# Patient Record
Sex: Male | Born: 1960 | Race: White | Hispanic: No | Smoking: Never smoker
Health system: Southern US, Community
[De-identification: ages and names within clinical notes are randomized; demographics above are authoritative.]

## PROBLEM LIST (undated history)

## (undated) DIAGNOSIS — J320 Chronic maxillary sinusitis: Secondary | ICD-10-CM

## (undated) DIAGNOSIS — M109 Gout, unspecified: Secondary | ICD-10-CM

## (undated) DIAGNOSIS — I10 Essential (primary) hypertension: Secondary | ICD-10-CM

## (undated) DIAGNOSIS — E7800 Pure hypercholesterolemia, unspecified: Secondary | ICD-10-CM

## (undated) DIAGNOSIS — G5622 Lesion of ulnar nerve, left upper limb: Secondary | ICD-10-CM

## (undated) DIAGNOSIS — M791 Myalgia, unspecified site: Secondary | ICD-10-CM

## (undated) DIAGNOSIS — R011 Cardiac murmur, unspecified: Secondary | ICD-10-CM

## (undated) DIAGNOSIS — S92109A Unspecified fracture of unspecified talus, initial encounter for closed fracture: Secondary | ICD-10-CM

## (undated) DIAGNOSIS — E785 Hyperlipidemia, unspecified: Secondary | ICD-10-CM

## (undated) DIAGNOSIS — R200 Anesthesia of skin: Secondary | ICD-10-CM

## (undated) DIAGNOSIS — R748 Abnormal levels of other serum enzymes: Secondary | ICD-10-CM

## (undated) DIAGNOSIS — D1801 Hemangioma of skin and subcutaneous tissue: Secondary | ICD-10-CM

## (undated) DIAGNOSIS — R6884 Jaw pain: Secondary | ICD-10-CM

## (undated) HISTORY — DX: Essential (primary) hypertension: I10

## (undated) HISTORY — DX: Lesion of ulnar nerve, left upper limb: G56.22

## (undated) HISTORY — DX: Cardiac murmur, unspecified: R01.1

## (undated) HISTORY — DX: Chronic maxillary sinusitis: J32.0

## (undated) HISTORY — DX: Abnormal levels of other serum enzymes: R74.8

## (undated) HISTORY — DX: Hyperlipidemia, unspecified: E78.5

## (undated) HISTORY — DX: Myalgia, unspecified site: M79.10

## (undated) HISTORY — DX: Pure hypercholesterolemia, unspecified: E78.00

## (undated) HISTORY — PX: ORIF ANKLE FRACTURE: SUR919

## (undated) HISTORY — DX: Jaw pain: R68.84

## (undated) HISTORY — DX: Anesthesia of skin: R20.0

## (undated) HISTORY — PX: KNEE ARTHROSCOPY: SUR90

## (undated) HISTORY — DX: Hemangioma of skin and subcutaneous tissue: D18.01

## (undated) HISTORY — DX: Unspecified fracture of unspecified talus, initial encounter for closed fracture: S92.109A

---

## 2011-11-02 DIAGNOSIS — I10 Essential (primary) hypertension: Secondary | ICD-10-CM | POA: Insufficient documentation

## 2014-09-18 ENCOUNTER — Emergency Department (HOSPITAL_COMMUNITY): Payer: BLUE CROSS/BLUE SHIELD | Admitting: Anesthesiology

## 2014-09-18 ENCOUNTER — Emergency Department (HOSPITAL_COMMUNITY): Payer: BLUE CROSS/BLUE SHIELD

## 2014-09-18 ENCOUNTER — Encounter (HOSPITAL_COMMUNITY): Payer: Self-pay | Admitting: Emergency Medicine

## 2014-09-18 ENCOUNTER — Inpatient Hospital Stay (HOSPITAL_COMMUNITY)
Admission: EM | Admit: 2014-09-18 | Discharge: 2014-09-20 | DRG: 343 | Disposition: A | Discharge status: Home / Self Care | Payer: BLUE CROSS/BLUE SHIELD | Attending: Surgery | Admitting: Surgery

## 2014-09-18 ENCOUNTER — Encounter (HOSPITAL_COMMUNITY): Admission: EM | Disposition: A | Discharge status: Home / Self Care | Payer: Self-pay | Source: Home / Self Care

## 2014-09-18 DIAGNOSIS — R1031 Right lower quadrant pain: Secondary | ICD-10-CM

## 2014-09-18 DIAGNOSIS — I10 Essential (primary) hypertension: Secondary | ICD-10-CM | POA: Diagnosis present

## 2014-09-18 DIAGNOSIS — Z79899 Other long term (current) drug therapy: Secondary | ICD-10-CM

## 2014-09-18 DIAGNOSIS — K3580 Unspecified acute appendicitis: Secondary | ICD-10-CM | POA: Diagnosis not present

## 2014-09-18 DIAGNOSIS — K37 Unspecified appendicitis: Secondary | ICD-10-CM | POA: Diagnosis present

## 2014-09-18 DIAGNOSIS — K36 Other appendicitis: Secondary | ICD-10-CM

## 2014-09-18 DIAGNOSIS — R109 Unspecified abdominal pain: Secondary | ICD-10-CM

## 2014-09-18 DIAGNOSIS — K3532 Acute appendicitis with perforation and localized peritonitis, without abscess: Secondary | ICD-10-CM | POA: Diagnosis present

## 2014-09-18 DIAGNOSIS — E780 Pure hypercholesterolemia: Secondary | ICD-10-CM | POA: Diagnosis present

## 2014-09-18 HISTORY — PX: LAPAROSCOPIC APPENDECTOMY: SHX408

## 2014-09-18 HISTORY — DX: Essential (primary) hypertension: I10

## 2014-09-18 HISTORY — DX: Pure hypercholesterolemia, unspecified: E78.00

## 2014-09-18 HISTORY — DX: Gout, unspecified: M10.9

## 2014-09-18 LAB — CBC WITH DIFFERENTIAL/PLATELET
BASOS ABS: 0 10*3/uL (ref 0.0–0.1)
Basophils Relative: 0 % (ref 0–1)
EOS ABS: 0 10*3/uL (ref 0.0–0.7)
Eosinophils Relative: 0 % (ref 0–5)
HEMATOCRIT: 45.1 % (ref 39.0–52.0)
Hemoglobin: 15.2 g/dL (ref 13.0–17.0)
LYMPHS PCT: 5 % — AB (ref 12–46)
Lymphs Abs: 0.7 10*3/uL (ref 0.7–4.0)
MCH: 30.2 pg (ref 26.0–34.0)
MCHC: 33.7 g/dL (ref 30.0–36.0)
MCV: 89.7 fL (ref 78.0–100.0)
Monocytes Absolute: 1.8 10*3/uL — ABNORMAL HIGH (ref 0.1–1.0)
Monocytes Relative: 14 % — ABNORMAL HIGH (ref 3–12)
NEUTROS ABS: 10.8 10*3/uL — AB (ref 1.7–7.7)
Neutrophils Relative %: 81 % — ABNORMAL HIGH (ref 43–77)
Platelets: 183 10*3/uL (ref 150–400)
RBC: 5.03 MIL/uL (ref 4.22–5.81)
RDW: 13.3 % (ref 11.5–15.5)
WBC: 13.4 10*3/uL — AB (ref 4.0–10.5)

## 2014-09-18 LAB — BASIC METABOLIC PANEL
Anion gap: 10 (ref 5–15)
BUN: 12 mg/dL (ref 6–23)
CHLORIDE: 107 mmol/L (ref 96–112)
CO2: 24 mmol/L (ref 19–32)
CREATININE: 1.08 mg/dL (ref 0.50–1.35)
Calcium: 9.3 mg/dL (ref 8.4–10.5)
GFR calc Af Amer: 87 mL/min — ABNORMAL LOW (ref >90)
GFR calc non Af Amer: 75 mL/min — ABNORMAL LOW (ref >90)
Glucose, Bld: 107 mg/dL — ABNORMAL HIGH (ref 70–99)
Potassium: 3.4 mmol/L — ABNORMAL LOW (ref 3.5–5.1)
Sodium: 141 mmol/L (ref 135–145)

## 2014-09-18 SURGERY — APPENDECTOMY, LAPAROSCOPIC
Anesthesia: General

## 2014-09-18 MED ORDER — ONDANSETRON HCL 4 MG PO TABS: 4.0000 mg | ORAL_TABLET | Freq: Four times a day (QID) | ORAL | Status: DC | PRN

## 2014-09-18 MED ORDER — DEXAMETHASONE SODIUM PHOSPHATE 10 MG/ML IJ SOLN
INTRAMUSCULAR | Status: AC
  Filled 2014-09-18: qty 1

## 2014-09-18 MED ORDER — GLYCOPYRROLATE 0.2 MG/ML IJ SOLN
INTRAMUSCULAR | Status: DC | PRN
  Administered 2014-09-18: .6 mg via INTRAVENOUS

## 2014-09-18 MED ORDER — MIDAZOLAM HCL 5 MG/5ML IJ SOLN
INTRAMUSCULAR | Status: DC | PRN
  Administered 2014-09-18 (×2): 1 mg via INTRAVENOUS

## 2014-09-18 MED ORDER — HYDROMORPHONE HCL 1 MG/ML IJ SOLN
INTRAMUSCULAR | Status: AC
  Filled 2014-09-18: qty 1

## 2014-09-18 MED ORDER — IBUPROFEN 200 MG PO TABS: 600.0000 mg | ORAL_TABLET | Freq: Four times a day (QID) | ORAL | Status: DC | PRN

## 2014-09-18 MED ORDER — MIDAZOLAM HCL 2 MG/2ML IJ SOLN
INTRAMUSCULAR | Status: AC
  Filled 2014-09-18: qty 2

## 2014-09-18 MED ORDER — CIPROFLOXACIN IN D5W 400 MG/200ML IV SOLN
INTRAVENOUS | Status: AC
  Filled 2014-09-18: qty 200

## 2014-09-18 MED ORDER — ACETAMINOPHEN 325 MG PO TABS: 650.0000 mg | ORAL_TABLET | Freq: Four times a day (QID) | ORAL | Status: DC | PRN

## 2014-09-18 MED ORDER — KCL IN DEXTROSE-NACL 20-5-0.45 MEQ/L-%-% IV SOLN
INTRAVENOUS | Status: DC
  Administered 2014-09-19: via INTRAVENOUS
  Filled 2014-09-18 (×3): qty 1000

## 2014-09-18 MED ORDER — METRONIDAZOLE IN NACL 5-0.79 MG/ML-% IV SOLN
500.0000 mg | Freq: Three times a day (TID) | INTRAVENOUS | Status: DC
Start: 2014-09-19 — End: 2014-09-20
  Administered 2014-09-19 – 2014-09-20 (×4): 500 mg via INTRAVENOUS
  Filled 2014-09-18 (×6): qty 100

## 2014-09-18 MED ORDER — ONDANSETRON HCL 4 MG/2ML IJ SOLN
INTRAMUSCULAR | Status: AC
  Filled 2014-09-18: qty 2

## 2014-09-18 MED ORDER — SODIUM CHLORIDE 0.9 % IV SOLN
INTRAVENOUS | Status: DC
  Administered 2014-09-18: 16:00:00 via INTRAVENOUS

## 2014-09-18 MED ORDER — HYDROMORPHONE HCL 1 MG/ML IJ SOLN
0.2500 mg | INTRAMUSCULAR | Status: DC | PRN
  Administered 2014-09-18 (×2): 0.5 mg via INTRAVENOUS

## 2014-09-18 MED ORDER — PROPOFOL 10 MG/ML IV BOLUS
INTRAVENOUS | Status: DC | PRN
  Administered 2014-09-18: 150 mg via INTRAVENOUS

## 2014-09-18 MED ORDER — IOHEXOL 300 MG/ML  SOLN
25.0000 mL | Freq: Once | INTRAMUSCULAR | Status: AC | PRN
  Administered 2014-09-18: 25 mL via ORAL

## 2014-09-18 MED ORDER — MORPHINE SULFATE 4 MG/ML IJ SOLN
4.0000 mg | Freq: Once | INTRAMUSCULAR | Status: AC
  Administered 2014-09-18: 4 mg via INTRAVENOUS
  Filled 2014-09-18: qty 1

## 2014-09-18 MED ORDER — SUCCINYLCHOLINE CHLORIDE 20 MG/ML IJ SOLN
INTRAMUSCULAR | Status: DC | PRN
  Administered 2014-09-18: 100 mg via INTRAVENOUS

## 2014-09-18 MED ORDER — LABETALOL HCL 5 MG/ML IV SOLN
5.0000 mg | INTRAVENOUS | Status: DC | PRN
Start: 2014-09-18 — End: 2014-09-18
  Administered 2014-09-18: 5 mg via INTRAVENOUS

## 2014-09-18 MED ORDER — HYDROCODONE-ACETAMINOPHEN 5-325 MG PO TABS
1.0000 | ORAL_TABLET | ORAL | Status: DC | PRN
  Administered 2014-09-18: 2 via ORAL
  Administered 2014-09-19 – 2014-09-20 (×4): 1 via ORAL
  Filled 2014-09-18 (×4): qty 1
  Filled 2014-09-18: qty 2

## 2014-09-18 MED ORDER — ONDANSETRON HCL 4 MG/2ML IJ SOLN: 4.0000 mg | Freq: Four times a day (QID) | INTRAMUSCULAR | Status: DC | PRN

## 2014-09-18 MED ORDER — HEPARIN SODIUM (PORCINE) 5000 UNIT/ML IJ SOLN
5000.0000 [IU] | Freq: Three times a day (TID) | INTRAMUSCULAR | Status: DC
  Filled 2014-09-18 (×7): qty 1

## 2014-09-18 MED ORDER — FENTANYL CITRATE (PF) 250 MCG/5ML IJ SOLN
INTRAMUSCULAR | Status: DC | PRN
  Administered 2014-09-18: 100 ug via INTRAVENOUS
  Administered 2014-09-18 (×3): 50 ug via INTRAVENOUS

## 2014-09-18 MED ORDER — BUPIVACAINE-EPINEPHRINE (PF) 0.25% -1:200000 IJ SOLN
INTRAMUSCULAR | Status: AC
  Filled 2014-09-18: qty 30

## 2014-09-18 MED ORDER — ONDANSETRON HCL 4 MG/2ML IJ SOLN
INTRAMUSCULAR | Status: DC | PRN
  Administered 2014-09-18: 4 mg via INTRAVENOUS

## 2014-09-18 MED ORDER — ALLOPURINOL 100 MG PO TABS
100.0000 mg | ORAL_TABLET | Freq: Every day | ORAL | Status: DC
  Filled 2014-09-18: qty 1

## 2014-09-18 MED ORDER — LABETALOL HCL 5 MG/ML IV SOLN
INTRAVENOUS | Status: AC
  Filled 2014-09-18: qty 4

## 2014-09-18 MED ORDER — LACTATED RINGERS IR SOLN
Status: DC | PRN
  Administered 2014-09-18: 1000 mL

## 2014-09-18 MED ORDER — FENTANYL CITRATE (PF) 250 MCG/5ML IJ SOLN
INTRAMUSCULAR | Status: AC
  Filled 2014-09-18: qty 5

## 2014-09-18 MED ORDER — AMLODIPINE BESYLATE 10 MG PO TABS
10.0000 mg | ORAL_TABLET | Freq: Every day | ORAL | Status: DC
  Administered 2014-09-19: 10 mg via ORAL
  Filled 2014-09-18 (×2): qty 1

## 2014-09-18 MED ORDER — LACTATED RINGERS IV SOLN
INTRAVENOUS | Status: DC
  Administered 2014-09-18: 20:00:00 via INTRAVENOUS

## 2014-09-18 MED ORDER — METRONIDAZOLE IN NACL 5-0.79 MG/ML-% IV SOLN
INTRAVENOUS | Status: AC
  Filled 2014-09-18: qty 100

## 2014-09-18 MED ORDER — BUPIVACAINE-EPINEPHRINE 0.25% -1:200000 IJ SOLN
INTRAMUSCULAR | Status: DC | PRN
  Administered 2014-09-18: 22 mL

## 2014-09-18 MED ORDER — CIPROFLOXACIN IN D5W 400 MG/200ML IV SOLN
400.0000 mg | Freq: Two times a day (BID) | INTRAVENOUS | Status: DC
Start: 2014-09-19 — End: 2014-09-20
  Administered 2014-09-19 – 2014-09-20 (×3): 400 mg via INTRAVENOUS
  Filled 2014-09-18 (×4): qty 200

## 2014-09-18 MED ORDER — ONDANSETRON HCL 4 MG/2ML IJ SOLN
4.0000 mg | Freq: Once | INTRAMUSCULAR | Status: AC
  Administered 2014-09-18: 4 mg via INTRAVENOUS
  Filled 2014-09-18: qty 2

## 2014-09-18 MED ORDER — BUPIVACAINE HCL (PF) 0.25 % IJ SOLN
INTRAMUSCULAR | Status: AC
  Filled 2014-09-18: qty 30

## 2014-09-18 MED ORDER — LACTATED RINGERS IV SOLN: INTRAVENOUS | Status: DC

## 2014-09-18 MED ORDER — GLYCOPYRROLATE 0.2 MG/ML IJ SOLN
INTRAMUSCULAR | Status: AC
  Filled 2014-09-18: qty 3

## 2014-09-18 MED ORDER — IOHEXOL 300 MG/ML  SOLN
100.0000 mL | Freq: Once | INTRAMUSCULAR | Status: AC | PRN
  Administered 2014-09-18: 100 mL via INTRAVENOUS

## 2014-09-18 MED ORDER — CIPROFLOXACIN IN D5W 400 MG/200ML IV SOLN
400.0000 mg | Freq: Once | INTRAVENOUS | Status: AC
  Administered 2014-09-18: 400 mg via INTRAVENOUS

## 2014-09-18 MED ORDER — 0.9 % SODIUM CHLORIDE (POUR BTL) OPTIME
TOPICAL | Status: DC | PRN
  Administered 2014-09-18: 1000 mL

## 2014-09-18 MED ORDER — LABETALOL HCL 5 MG/ML IV SOLN
INTRAVENOUS | Status: DC | PRN
  Administered 2014-09-18 (×2): 5 mg via INTRAVENOUS

## 2014-09-18 MED ORDER — DEXAMETHASONE SODIUM PHOSPHATE 10 MG/ML IJ SOLN
INTRAMUSCULAR | Status: DC | PRN
  Administered 2014-09-18: 10 mg via INTRAVENOUS

## 2014-09-18 MED ORDER — NEOSTIGMINE METHYLSULFATE 10 MG/10ML IV SOLN
INTRAVENOUS | Status: DC | PRN
  Administered 2014-09-18: 4 mg via INTRAVENOUS

## 2014-09-18 MED ORDER — CISATRACURIUM BESYLATE (PF) 10 MG/5ML IV SOLN
INTRAVENOUS | Status: DC | PRN
  Administered 2014-09-18: 2 mg via INTRAVENOUS
  Administered 2014-09-18: 5 mg via INTRAVENOUS

## 2014-09-18 MED ORDER — METRONIDAZOLE IN NACL 5-0.79 MG/ML-% IV SOLN
500.0000 mg | Freq: Once | INTRAVENOUS | Status: AC
  Administered 2014-09-18: 500 mg via INTRAVENOUS

## 2014-09-18 MED ORDER — PROPOFOL 10 MG/ML IV BOLUS
INTRAVENOUS | Status: AC
  Filled 2014-09-18: qty 20

## 2014-09-18 MED ORDER — MORPHINE SULFATE 2 MG/ML IJ SOLN: 1.0000 mg | INTRAMUSCULAR | Status: DC | PRN

## 2014-09-18 MED ORDER — LACTATED RINGERS IV SOLN
INTRAVENOUS | Status: DC | PRN
  Administered 2014-09-18: 18:00:00 via INTRAVENOUS

## 2014-09-18 SURGICAL SUPPLY — 37 items
APPLIER CLIP ROT 10 11.4 M/L (STAPLE)
BENZOIN TINCTURE PRP APPL 2/3 (GAUZE/BANDAGES/DRESSINGS) ×3 IMPLANT
CLIP APPLIE ROT 10 11.4 M/L (STAPLE) IMPLANT
CLOSURE WOUND 1/2 X4 (GAUZE/BANDAGES/DRESSINGS) ×1
CUTTER FLEX LINEAR 45M (STAPLE) ×3 IMPLANT
DECANTER SPIKE VIAL GLASS SM (MISCELLANEOUS) IMPLANT
DRAPE LAPAROSCOPIC ABDOMINAL (DRAPES) ×3 IMPLANT
ELECT REM PT RETURN 9FT ADLT (ELECTROSURGICAL) ×3
ELECTRODE REM PT RTRN 9FT ADLT (ELECTROSURGICAL) ×1 IMPLANT
ENDOLOOP SUT PDS II  0 18 (SUTURE)
ENDOLOOP SUT PDS II 0 18 (SUTURE) IMPLANT
GLOVE BIOGEL PI IND STRL 7.0 (GLOVE) ×1 IMPLANT
GLOVE BIOGEL PI INDICATOR 7.0 (GLOVE) ×2
GLOVE SURG SIGNA 7.5 PF LTX (GLOVE) ×3 IMPLANT
GOWN SPEC L4 XLG W/TWL (GOWN DISPOSABLE) ×3 IMPLANT
GOWN STRL REUS W/ TWL XL LVL3 (GOWN DISPOSABLE) ×3 IMPLANT
GOWN STRL REUS W/TWL LRG LVL3 (GOWN DISPOSABLE) ×3 IMPLANT
GOWN STRL REUS W/TWL XL LVL3 (GOWN DISPOSABLE) ×6
KIT BASIN OR (CUSTOM PROCEDURE TRAY) ×3 IMPLANT
LIQUID BAND (GAUZE/BANDAGES/DRESSINGS) IMPLANT
PENCIL BUTTON HOLSTER BLD 10FT (ELECTRODE) IMPLANT
POUCH SPECIMEN RETRIEVAL 10MM (ENDOMECHANICALS) ×3 IMPLANT
RELOAD 45 VASCULAR/THIN (ENDOMECHANICALS) IMPLANT
RELOAD STAPLE TA45 3.5 REG BLU (ENDOMECHANICALS) ×3 IMPLANT
SET IRRIG TUBING LAPAROSCOPIC (IRRIGATION / IRRIGATOR) ×3 IMPLANT
SHEARS HARMONIC ACE PLUS 36CM (ENDOMECHANICALS) ×3 IMPLANT
SOLUTION ANTI FOG 6CC (MISCELLANEOUS) IMPLANT
STRIP CLOSURE SKIN 1/2X4 (GAUZE/BANDAGES/DRESSINGS) ×2 IMPLANT
SUT MNCRL AB 4-0 PS2 18 (SUTURE) ×3 IMPLANT
TOWEL OR 17X26 10 PK STRL BLUE (TOWEL DISPOSABLE) ×3 IMPLANT
TOWEL OR NON WOVEN STRL DISP B (DISPOSABLE) ×3 IMPLANT
TRAY FOLEY W/METER SILVER 14FR (SET/KITS/TRAYS/PACK) ×3 IMPLANT
TRAY LAPAROSCOPIC (CUSTOM PROCEDURE TRAY) ×3 IMPLANT
TROCAR BLADELESS OPT 5 100 (ENDOMECHANICALS) ×3 IMPLANT
TROCAR XCEL BLUNT TIP 100MML (ENDOMECHANICALS) ×3 IMPLANT
TROCAR XCEL NON-BLD 11X100MML (ENDOMECHANICALS) ×3 IMPLANT
TUBING INSUFFLATION 10FT LAP (TUBING) ×3 IMPLANT

## 2014-09-18 NOTE — ED Notes (Signed)
Pt from home c/o RLQ pain with fever x2 days. [facility] here for possible appendicitis. Pt has not taken any OTC meds for pain or fever. Pt is A&O and in NAD

## 2014-09-18 NOTE — Anesthesia Preprocedure Evaluation (Signed)
Anesthesia Evaluation  Patient identified by MRN, date of birth, ID band Patient awake    Reviewed: Allergy & Precautions, H&P , NPO status , Patient's Chart, lab work & pertinent test results  Airway Mallampati: II  TM Distance: >3 FB Neck ROM: full    Dental  (+) Chipped, Dental Advisory Given Small chip right upper front:   Pulmonary neg pulmonary ROS,  breath sounds clear to auscultation  Pulmonary exam normal       Cardiovascular Exercise Tolerance: Good hypertension, Pt. on medications Rhythm:regular Rate:Normal     Neuro/Psych negative neurological ROS  negative psych ROS   GI/Hepatic negative GI ROS, Neg liver ROS,   Endo/Other  negative endocrine ROS  Renal/GU negative Renal ROS  negative genitourinary   Musculoskeletal   Abdominal   Peds  Hematology negative hematology ROS (+)   Anesthesia Other Findings   Reproductive/Obstetrics negative OB ROS                             Anesthesia Physical Anesthesia Plan  ASA: II  Anesthesia Plan: General   Post-op Pain Management:    Induction: Intravenous, Rapid sequence and Cricoid pressure planned  Airway Management Planned: Oral ETT  Additional Equipment:   Intra-op Plan:   Post-operative Plan: Extubation in OR  Informed Consent: I have reviewed the patients History and Physical, chart, labs and discussed the procedure including the risks, benefits and alternatives for the proposed anesthesia with the patient or authorized representative who has indicated his/her understanding and acceptance.   Dental Advisory Given  Plan Discussed with: CRNA and Surgeon  Anesthesia Plan Comments:         Anesthesia Quick Evaluation

## 2014-09-18 NOTE — ED Provider Notes (Signed)
CSN: [id number]     Arrival date & time 09/18/14  1434 History   First MD Initiated Contact with Patient 09/18/14 1516     Chief Complaint  Patient presents with  . Abdominal Pain  . Fever     (Consider location/radiation/quality/duration/timing/severity/associated sxs/prior Treatment) HPI Comments: Pt seen at [facility] and referred here to r/o appy--denies dysuria or hematuria  Patient is a 54 y.o. male presenting with abdominal pain and fever. The history is provided by the patient.  Abdominal Pain Pain location:  RLQ Pain quality: stabbing   Pain radiates to:  Does not radiate Pain severity:  Moderate Onset quality:  Sudden Duration:  2 days Timing:  Constant Progression:  Worsening Chronicity:  New Relieved by:  Nothing Worsened by:  Movement Ineffective treatments:  None tried Associated symptoms: fever   Fever   Past Medical History  Diagnosis Date  . Hypertension   . Hypercholesteremia   . Gout    Past Surgical History  Procedure Laterality Date  . Orif ankle fracture Right   . Knee arthroscopy Bilateral    No family history on file. History  Substance Use Topics  . Smoking status: Never Smoker   . Smokeless tobacco: Not on file  . Alcohol Use: Yes     Comment: social    Review of Systems  Constitutional: Positive for fever.  Gastrointestinal: Positive for abdominal pain.  All other systems reviewed and are negative.     Allergies  Penicillins and Lipitor  Home Medications   Prior to Admission medications   Not on File   BP 123/80 mmHg  Pulse 90  Temp(Src) 100.2 F (37.9 C) (Oral)  Resp 20  SpO2 96% Physical Exam  Constitutional: He is oriented to person, place, and time. He appears well-developed and well-nourished.  Non-toxic appearance. No distress.  HENT:  Head: Normocephalic and atraumatic.  Eyes: Conjunctivae, EOM and lids are normal. Pupils are equal, round, and reactive to light.  Neck: Normal range of motion. Neck supple. No  tracheal deviation present. No thyroid mass present.  Cardiovascular: Normal rate, regular rhythm and normal heart sounds.  Exam reveals no gallop.   No murmur heard. Pulmonary/Chest: Effort normal and breath sounds normal. No stridor. No respiratory distress. He has no decreased breath sounds. He has no wheezes. He has no rhonchi. He has no rales.  Abdominal: Soft. Normal appearance and bowel sounds are normal. He exhibits no distension. There is tenderness in the right lower quadrant. There is no rigidity, no rebound, no guarding and no CVA tenderness.    Musculoskeletal: Normal range of motion. He exhibits no edema or tenderness.  Neurological: He is alert and oriented to person, place, and time. He has normal strength. No cranial nerve deficit or sensory deficit. GCS eye subscore is 4. GCS verbal subscore is 5. GCS motor subscore is 6.  Skin: Skin is warm and dry. No abrasion and no rash noted.  Psychiatric: He has a normal mood and affect. His speech is normal and behavior is normal.  Nursing note and vitals reviewed.   ED Course  Procedures (including critical care time) Labs Review Labs Reviewed  CBC WITH DIFFERENTIAL/PLATELET  BASIC METABOLIC PANEL    Imaging Review No results found.   EKG Interpretation None      MDM   Final diagnoses:  Abdominal pain    Patient given IV fluids and pain medication here. Abdominal CT consistent with appendicitis. Dr. Ezzard Standing from general surgery has assumed care of the patient  Noah NickAnthony Mckinnley Smithey, MD 09/18/14 [phone]

## 2014-09-18 NOTE — ED Notes (Signed)
Patient transported to CT 

## 2014-09-18 NOTE — Transfer of Care (Signed)
Immediate Anesthesia Transfer of Care Note  Patient: Noah Lyons  Procedure(s) Performed: Procedure(s): APPENDECTOMY LAPAROSCOPIC (N/A)  Patient Location: PACU  Anesthesia Type:General  Level of Consciousness: awake  Airway & Oxygen Therapy: Patient Spontanous Breathing and Patient connected to face mask oxygen  Post-op Assessment: Report given to RN and Post -op Vital signs reviewed and stable  Post vital signs: Reviewed and stable  Last Vitals:  Filed Vitals:   09/18/14 1502  BP: 123/80  Pulse: 90  Temp: 37.9 C  Resp: 20    Complications: No apparent anesthesia complications

## 2014-09-18 NOTE — Op Note (Signed)
Re:   Noah PlantRichard Lyons DOB:   Apr 24, 1961 MRN:   [id number]                   FACILITY:  [facility]  DATE OF PROCEDURE: 09/18/2014                              OPERATIVE REPORT  PREOPERATIVE DIAGNOSIS:  Appendicitis  POSTOPERATIVE DIAGNOSIS:  Acute ruptured appendicitis with non walled off abscess.  PROCEDURE:  Laparoscopic appendectomy.  SURGEON:  Sandria Balesavid H. Ezzard StandingNewman, MD  ASSISTANT:  No first assistant.  ANESTHESIA:  General endotracheal.  Anesthesiologist: Ronelle Nighharles Ewell, MD CRNA: Florene Routeiana L Reardon, CRNA; Delphia GratesLoraine Chandler, CRNA  ASA:  2E  ESTIMATED BLOOD LOSS:  Minimal.  DRAINS: none   SPECIMEN:   Appendix  COUNTS CORRECT:  YES  INDICATIONS FOR PROCEDURE: Noah Lyons is a 54 y.o. (DOB: Apr 24, 1961) white  male whose primary care doctor is CARTWRIGHT,SARAH, MD and comes to the OR for an appendectomy.   I discussed with the patient, the indications and potential complications of appendiceal surgery.  The potential complications include, but are not limited to, bleeding, open surgery, bowel resection, and the possibility of another diagnosis.  OPERATIVE NOTE:  The patient underwent a general endotracheal anesthetic as supervised by Anesthesiologist: Ronelle Nighharles Ewell, MD CRNA: Florene Routeiana L Reardon, CRNA; Delphia GratesLoraine Chandler, CRNA, General, in room #[facility].  The patient was given Cipro and Flagyl at the beginning of the procedure and the abdomen was prepped with ChloraPrep.  The patient did not have a foley catheter placed.  A time-out was held and surgical checklist run.  An infraumbilical incision was made with sharp dissection carried down to the abdominal cavity.  An 12 mm Hasson trocar was inserted through the infraumbilical incision and into the peritoneal cavity.  A 0 degree 10 mm laparoscope was inserted through a 12 mm Hasson trocar and the Hasson trocar secured with a 0 Vicryl suture.  I placed a 5 mm trocar in the right upper quadrant and 5 mm torcar in left lower quadrant and did  abdominal exploration.    The right and left lobes of liver unremarkable.  Stomach was unremarkable.  The pelvic organs were unremarkable.  I saw no other intra-abdominal abnormality.  The patient had appendicitis with the appendix located underneath the TI and inferior to the cecum.  There was a focal rupture of the appendix near the tip with focal non contained abscess.  The mesentery of the appendix was divided with a Harmonic scalpel.  I got to the base of the appendix.  I then used a blue load 45 mm Ethicon Endo-GIA stapler and fired this across the base of the appendix.  I placed the appendix in EndoCatch bag and delivered the bag through the umbilical incision.  I irrigated the abdomen with 1,000 cc of saline.  The saline was clear after the irrigation.  After irrigating the abdomen, I then removed the trocars, in turn.  The umbilical port fascia was closed with 0 Vicryl suture.   I closed the skin each site with a 5-0 Monocryl suture and painted the wounds with Dermabond.  I then injected a total of 22 mL of 0.25% Marcaine at the incisions.  Sponge and needle count were correct at the end of the case.  The foley catheter was removed in the OR.  The patient was transferred to the recovery room in good condition.  The  patient tolerated the procedure well and it depends on the patient's post op clinical course as to when the patient could be discharged.  He will need at least one week of antibiotics.   Ovidio Kin, MD, [facility] Pager: [phone] Office phone:  [phone]
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
## 2014-09-18 NOTE — Anesthesia Postprocedure Evaluation (Signed)
  Anesthesia Post-op Note  Patient: Noah Lyons  Procedure(s) Performed: Procedure(s) (LRB): APPENDECTOMY LAPAROSCOPIC (N/A)  Patient Location: PACU  Anesthesia Type: General  Level of Consciousness: awake and alert   Airway and Oxygen Therapy: Patient Spontanous Breathing  Post-op Pain: mild  Post-op Assessment: Post-op Vital signs reviewed, Patient's Cardiovascular Status Stable, Respiratory Function Stable, Patent Airway and No signs of Nausea or vomiting  Last Vitals:  Filed Vitals:   09/18/14 1502  BP: 123/80  Pulse: 90  Temp: 37.9 C  Resp: 20    Post-op Vital Signs: stable   Complications: No apparent anesthesia complications

## 2014-09-18 NOTE — Anesthesia Procedure Notes (Signed)
Procedure Name: Intubation Date/Time: 09/18/2014 6:09 PM Performed by: Delphia GratesHANDLER, Jaylee Freeze Pre-anesthesia Checklist: Patient identified, Emergency Drugs available, Suction available and Patient being monitored Patient Re-evaluated:Patient Re-evaluated prior to inductionOxygen Delivery Method: Circle system utilized Preoxygenation: Pre-oxygenation with 100% oxygen Intubation Type: IV induction and Cricoid Pressure applied Laryngoscope Size: Mac and 4 Grade View: Grade I Tube type: Oral Tube size: 7.5 mm Number of attempts: 1 Airway Equipment and Method: Stylet Placement Confirmation: ETT inserted through vocal cords under direct vision,  breath sounds checked- equal and bilateral and positive ETCO2 Secured at: 21 cm Tube secured with: Tape Dental Injury: Teeth and Oropharynx as per pre-operative assessment  Comments: Small chip Rt upper tooth preop.

## 2014-09-19 ENCOUNTER — Encounter (HOSPITAL_COMMUNITY): Payer: Self-pay | Admitting: Surgery

## 2014-09-19 DIAGNOSIS — E780 Pure hypercholesterolemia: Secondary | ICD-10-CM | POA: Diagnosis present

## 2014-09-19 DIAGNOSIS — Z79899 Other long term (current) drug therapy: Secondary | ICD-10-CM | POA: Diagnosis not present

## 2014-09-19 DIAGNOSIS — I10 Essential (primary) hypertension: Secondary | ICD-10-CM | POA: Diagnosis present

## 2014-09-19 DIAGNOSIS — R109 Unspecified abdominal pain: Secondary | ICD-10-CM | POA: Diagnosis present

## 2014-09-19 DIAGNOSIS — K3532 Acute appendicitis with perforation and localized peritonitis, without abscess: Secondary | ICD-10-CM | POA: Diagnosis present

## 2014-09-19 DIAGNOSIS — K3580 Unspecified acute appendicitis: Secondary | ICD-10-CM | POA: Diagnosis present

## 2014-09-19 LAB — CBC WITH DIFFERENTIAL/PLATELET
BASOS ABS: 0 10*3/uL (ref 0.0–0.1)
Basophils Relative: 0 % (ref 0–1)
Eosinophils Absolute: 0 10*3/uL (ref 0.0–0.7)
Eosinophils Relative: 0 % (ref 0–5)
HEMATOCRIT: 39.3 % (ref 39.0–52.0)
Hemoglobin: 13.1 g/dL (ref 13.0–17.0)
LYMPHS PCT: 4 % — AB (ref 12–46)
Lymphs Abs: 0.6 10*3/uL — ABNORMAL LOW (ref 0.7–4.0)
MCH: 30 pg (ref 26.0–34.0)
MCHC: 33.3 g/dL (ref 30.0–36.0)
MCV: 90.1 fL (ref 78.0–100.0)
MONO ABS: 1.1 10*3/uL — AB (ref 0.1–1.0)
MONOS PCT: 8 % (ref 3–12)
NEUTROS ABS: 12 10*3/uL — AB (ref 1.7–7.7)
NEUTROS PCT: 88 % — AB (ref 43–77)
PLATELETS: 177 10*3/uL (ref 150–400)
RBC: 4.36 MIL/uL (ref 4.22–5.81)
RDW: 13.5 % (ref 11.5–15.5)
WBC: 13.6 10*3/uL — ABNORMAL HIGH (ref 4.0–10.5)

## 2014-09-19 MED ORDER — ALLOPURINOL 100 MG PO TABS
100.0000 mg | ORAL_TABLET | Freq: Every day | ORAL | Status: DC
Start: 2014-09-19 — End: 2014-09-20
  Administered 2014-09-19: 100 mg via ORAL
  Filled 2014-09-19 (×2): qty 1

## 2014-09-19 NOTE — Progress Notes (Signed)
Patient ID: Noah Lyons, male   DOB: Jan 27, 1961, 54 y.o.   MRN: [id number]     Welcome      [address] [place], [place] [address]    Phone: [phone] FAX: [phone]     Subjective: Passing flatus.  No n/v.  Ambulating.  Voiding.  Wants food.   Has a scheduled trip to [place] on Wednesday morning.  I expressed my concerns given that it is a 12h flight with 1 layover, but insists on going.   Objective:  Vital signs:  Filed Vitals:   09/18/14 2215 09/18/14 2317 09/19/14 0211 09/19/14 0456  BP: 164/84 132/84 131/85 122/78  Pulse: 111 107 98 91  Temp: 99.8 F (37.7 C) 99.6 F (37.6 C) 99.4 F (37.4 C) 99 F (37.2 C)  TempSrc: Oral Oral Oral Oral  Resp:    16  Height:      Weight:      SpO2: 96% 91% 99% 95%    Last BM Date: 09/18/14  Intake/Output   Yesterday:  04/24 0701 - 04/25 0700 In: 3010 [P.O.:960; I.V.:2050] Out: 100 [Urine:50; Blood:50] This shift:    I/O last 3 completed shifts: In: 3010 [P.O.:960; I.V.:2050] Out: 100 [Urine:50; Blood:50]    Physical Exam: General: Pt awake/alert/oriented x4 in no acute distress Abdomen: Soft.  Nondistended.  Mildly tender at incisions only.  No evidence of peritonitis.  No incarcerated hernias.    Problem List:   Active Problems:   Appendicitis    Results:   Labs: Results for orders placed or performed during the hospital encounter of 09/18/14 (from the past 48 hour(s))  CBC with Differential/Platelet     Status: Abnormal   Collection Time: 09/18/14  3:30 PM  Result Value Ref Range   WBC 13.4 (H) 4.0 - 10.5 K/uL   RBC 5.03 4.22 - 5.81 MIL/uL   Hemoglobin 15.2 13.0 - 17.0 g/dL   HCT 45.1 39.0 - 52.0 %   MCV 89.7 78.0 - 100.0 fL   MCH 30.2 26.0 - 34.0 pg   MCHC 33.7 30.0 - 36.0 g/dL   RDW 13.3 11.5 - 15.5 %   Platelets 183 150 - 400 K/uL   Neutrophils Relative % 81 (H) 43 - 77 %   Neutro Abs 10.8 (H) 1.7 - 7.7 K/uL   Lymphocytes Relative 5 (L)  12 - 46 %   Lymphs Abs 0.7 0.7 - 4.0 K/uL   Monocytes Relative 14 (H) 3 - 12 %   Monocytes Absolute 1.8 (H) 0.1 - 1.0 K/uL   Eosinophils Relative 0 0 - 5 %   Eosinophils Absolute 0.0 0.0 - 0.7 K/uL   Basophils Relative 0 0 - 1 %   Basophils Absolute 0.0 0.0 - 0.1 K/uL  Basic metabolic panel     Status: Abnormal   Collection Time: 09/18/14  3:30 PM  Result Value Ref Range   Sodium 141 135 - 145 mmol/L   Potassium 3.4 (L) 3.5 - 5.1 mmol/L   Chloride 107 96 - 112 mmol/L   CO2 24 19 - 32 mmol/L   Glucose, Bld 107 (H) 70 - 99 mg/dL   BUN 12 6 - 23 mg/dL   Creatinine, Ser 1.08 0.50 - 1.35 mg/dL   Calcium 9.3 8.4 - 10.5 mg/dL   GFR calc non Af Amer 75 (L) >90 mL/min   GFR calc Af Amer 87 (L) >90 mL/min    Comment: (NOTE) The eGFR has been calculated using  the CKD EPI equation. This calculation has not been validated in all clinical situations. eGFR's persistently <90 mL/min signify possible Chronic Kidney Disease.    Anion gap 10 5 - 15  CBC with Differential/Platelet     Status: Abnormal   Collection Time: 09/19/14  4:55 AM  Result Value Ref Range   WBC 13.6 (H) 4.0 - 10.5 K/uL   RBC 4.36 4.22 - 5.81 MIL/uL   Hemoglobin 13.1 13.0 - 17.0 g/dL   HCT 39.3 39.0 - 52.0 %   MCV 90.1 78.0 - 100.0 fL   MCH 30.0 26.0 - 34.0 pg   MCHC 33.3 30.0 - 36.0 g/dL   RDW 13.5 11.5 - 15.5 %   Platelets 177 150 - 400 K/uL   Neutrophils Relative % 88 (H) 43 - 77 %   Neutro Abs 12.0 (H) 1.7 - 7.7 K/uL   Lymphocytes Relative 4 (L) 12 - 46 %   Lymphs Abs 0.6 (L) 0.7 - 4.0 K/uL   Monocytes Relative 8 3 - 12 %   Monocytes Absolute 1.1 (H) 0.1 - 1.0 K/uL   Eosinophils Relative 0 0 - 5 %   Eosinophils Absolute 0.0 0.0 - 0.7 K/uL   Basophils Relative 0 0 - 1 %   Basophils Absolute 0.0 0.0 - 0.1 K/uL    Imaging / Studies: Ct Abdomen Pelvis W Contrast  09/18/2014   CLINICAL DATA:  54 year old male with a history of right lower quadrant pain and fever  EXAM: CT ABDOMEN AND PELVIS WITH CONTRAST   TECHNIQUE: Multidetector CT imaging of the abdomen and pelvis was performed using the standard protocol following bolus administration of intravenous contrast.  CONTRAST:  45mL OMNIPAQUE IOHEXOL 300 MG/ML SOLN, 138mL OMNIPAQUE IOHEXOL 300 MG/ML SOLN  COMPARISON:  None.  FINDINGS: Lower chest:  Unremarkable appearance of the soft tissues of the chest wall.  Heart size within normal limits.  No pericardial fluid/thickening.  No lower mediastinal adenopathy.  Unremarkable appearance of the distal esophagus.  No hiatal hernia.  No confluent airspace disease, pleural fluid, or pneumothorax within visualized lung.  Abdomen pelvis:  Unremarkable appearance of liver, spleen, bilateral adrenal glands.  Unremarkable pancreas.  Unremarkable gallbladder.  No hydronephrosis or nephrolithiasis. Unremarkable course of the bilateral ureters.  Unremarkable appearance of the urinary bladder.  Oral contrast extends to the mid small bowel. No abnormally distended small bowel loops.  The appendix measures 12 mm in diameter. Inflammatory changes surround the appendix in the adjacent mesentery. No evidence of focal fluid collection/abscess. No free air to indicate perforation.  Colonic diverticula without associated inflammatory changes.  Scattered atherosclerotic calcifications of the abdominal aorta and mesenteric vessels. No aneurysm or dissection flap. Calcifications of the bilateral iliofemoral system and pelvic vasculature.  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 New  No displaced fracture.  Multilevel degenerative disc disease.  No bony canal narrowing.  IMPRESSION: Acute appendicitis with no complicating features.  These results were discussed by telephone at the time of interpretation on 09/18/2014 at 5:10 pm to Dr. Lucia Gaskins of surgery who verbally acknowledged these results.  Incidental note made of a circumaortic renal vein with insertion of the retro aortic component at L3.   Atherosclerosis.  Signed,  Dulcy Fanny. Earleen Newport, DO  [facility]  [facility]   Electronically Signed   By: Corrie Mckusick D.O.   On: 09/18/2014 17:11    Medications / Allergies:  Scheduled Meds: . allopurinol  100 mg Oral  Daily  . amLODipine  10 mg Oral Daily  . ciprofloxacin  400 mg Intravenous Q12H  . heparin subcutaneous  5,000 Units Subcutaneous 3 times per day  . metronidazole  500 mg Intravenous Q8H   Continuous Infusions: . dextrose 5 % and 0.45 % NaCl with KCl 20 mEq/L 100 mL/hr at 09/19/14 0000   PRN Meds:.acetaminophen, HYDROcodone-acetaminophen, ibuprofen, morphine injection, ondansetron **OR** ondansetron (ZOFRAN) IV  Antibiotics: Anti-infectives    Start     Dose/Rate Route Frequency Ordered Stop   09/19/14 0600  ciprofloxacin (CIPRO) IVPB 400 mg     400 mg 200 mL/hr over 60 Minutes Intravenous Every 12 hours 09/18/14 2022     09/19/14 0000  metroNIDAZOLE (FLAGYL) IVPB 500 mg     500 mg 100 mL/hr over 60 Minutes Intravenous Every 8 hours 09/18/14 2022     09/18/14 1800  ciprofloxacin (CIPRO) IVPB 400 mg     400 mg 200 mL/hr over 60 Minutes Intravenous  Once 09/18/14 1753 09/18/14 1815   09/18/14 1800  metroNIDAZOLE (FLAGYL) IVPB 500 mg     500 mg 100 mL/hr over 60 Minutes Intravenous  Once 09/18/14 1754 09/18/14 1848        Assessment/Plan Acute ruptured appendicitis with non walled off abscess POD#1 laparoscopic appendectomy---Dr. Lucia Gaskins -stable -repeat CBC in AM -mobilizing -add IS -DC IVF ID-D#1/7 for ruptured appendicitis  VTE prophylaxis--SCD/heparin FEN-advance diet, on PO pain meds Dispo-possibly tomorrow if he remains afebrile x24h.  I expressed my concerns about flying so early.  verbalizes understanding that he may develop DVT/PE    Erby Pian, [facility] Pager [phone](7A-[phone])   09/19/2014 9:29 AM

## 2014-09-20 LAB — CBC
HCT: 40.4 % (ref 39.0–52.0)
HEMOGLOBIN: 13.4 g/dL (ref 13.0–17.0)
MCH: 30.2 pg (ref 26.0–34.0)
MCHC: 33.2 g/dL (ref 30.0–36.0)
MCV: 91.2 fL (ref 78.0–100.0)
PLATELETS: 168 10*3/uL (ref 150–400)
RBC: 4.43 MIL/uL (ref 4.22–5.81)
RDW: 13.9 % (ref 11.5–15.5)
WBC: 10.5 10*3/uL (ref 4.0–10.5)

## 2014-09-20 MED ORDER — METRONIDAZOLE 500 MG PO TABS: 500.0000 mg | ORAL_TABLET | Freq: Three times a day (TID) | ORAL | Status: DC

## 2014-09-20 MED ORDER — IBUPROFEN 600 MG PO TABS: 600.0000 mg | ORAL_TABLET | Freq: Four times a day (QID) | ORAL | Status: DC | PRN

## 2014-09-20 MED ORDER — CYCLOBENZAPRINE HCL 5 MG PO TABS: 5.0000 mg | ORAL_TABLET | Freq: Three times a day (TID) | ORAL | Status: DC | PRN

## 2014-09-20 MED ORDER — HYDROCODONE-ACETAMINOPHEN 5-325 MG PO TABS: 1.0000 | ORAL_TABLET | Freq: Four times a day (QID) | ORAL | Status: DC | PRN

## 2014-09-20 MED ORDER — CIPROFLOXACIN HCL 500 MG PO TABS: 500.0000 mg | ORAL_TABLET | Freq: Two times a day (BID) | ORAL | Status: DC

## 2014-09-20 NOTE — Discharge Instructions (Signed)

## 2014-09-20 NOTE — Progress Notes (Signed)
Patient, at 0630, started to complained of burning sensation at the IV site in R. Anterior forearm. Cipro running at the time. No noted infiltration/phlebitis. IV site slightly pink. IV discontinued, hot treatment applied, on-call physician made aware, and IV team notified. "I may no longer need this antibiotic. Until I see the doctor, I do not want to continue with the antibiotic." Patient has denied burning since discontinuing drip.

## 2014-09-20 NOTE — Discharge Summary (Signed)
Physician Discharge Summary  Patient ID: Noah Lyons MRN: [id number] DOB/AGE: April 09, 1961 54 y.o.  Admit date: 09/18/2014 Discharge date: 09/20/2014  Admitting Diagnosis: Acute cholecystitis    Discharge Diagnosis Patient Active Problem List   Diagnosis Date Noted  . Ruptured appendicitis 09/19/2014  . Appendicitis 09/18/2014    Consultants none  Imaging: Ct Abdomen Pelvis W Contrast  09/18/2014   CLINICAL DATA:  54 year old male with a history of right lower quadrant pain and fever  EXAM: CT ABDOMEN AND PELVIS WITH CONTRAST  TECHNIQUE: Multidetector CT imaging of the abdomen and pelvis was performed using the standard protocol following bolus administration of intravenous contrast.  CONTRAST:  25mL OMNIPAQUE IOHEXOL 300 MG/ML SOLN, OMNIPAQUE IOHEXOL 300 MG/ML SOLN  COMPARISON:  None.  FINDINGS: Lower chest:  Unremarkable appearance of the soft tissues of the chest wall.  Heart size within normal limits.  No pericardial fluid/thickening.  No lower mediastinal adenopathy.  Unremarkable appearance of the distal esophagus.  No hiatal hernia.  No confluent airspace disease, pleural fluid, or pneumothorax within visualized lung.  Abdomen pelvis:  Unremarkable appearance of liver, spleen, bilateral adrenal glands.  Unremarkable pancreas.  Unremarkable gallbladder.  No hydronephrosis or nephrolithiasis. Unremarkable course of the bilateral ureters.  Unremarkable appearance of the urinary bladder.  Oral contrast extends to the mid small bowel. No abnormally distended small bowel loops.  The appendix measures 12 mm in diameter. Inflammatory changes surround the appendix in the adjacent mesentery. No evidence of focal fluid collection/abscess. No free air to indicate perforation.  Colonic diverticula without associated inflammatory changes.  Scattered atherosclerotic calcifications of the abdominal aorta and mesenteric vessels. No aneurysm or dissection flap. Calcifications of the bilateral  iliofemoral system and pelvic vasculature.  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 New  No displaced fracture.  Multilevel degenerative disc disease.  No bony canal narrowing.  IMPRESSION: Acute appendicitis with no complicating features.  These results were discussed by telephone at the time of interpretation on 09/18/2014 at 5:10 pm to Dr. Ezzard Standing of surgery who verbally acknowledged these results.  Incidental note made of a circumaortic renal vein with insertion of the retro aortic component at L3.  Atherosclerosis.  Signed,  Yvone Neu. Noah Ave, DO  [facility]  [facility]   Electronically Signed   By: Gilmer Mor D.O.   On: 09/18/2014 17:11    Procedures Laparoscopic appendectomy---Dr. Marty Heck Course:  Noah Lyons is a healthy 54 year old male who presented to [facility] with abdominal pain.  Workup showed acute appendicitis.  Patient was admitted and underwent procedure listed above.  He was found to have Acute ruptured appendicitis with non walled off abscess   Tolerated procedure well and was transferred to the floor.  Diet was advanced as tolerated.  On POD#2, the patient was voiding well, tolerating diet, ambulating well, pain well controlled, vital signs stable, incisions c/d/i and felt stable for discharge home.  He was advised not to travel especially withhin the first week due to risk of abscess and VTE.  He verbalizes understanding.  Medication risks, benefits and therapeutic alternatives were reviewed with the patient.  He verbalizes understanding. He was sent with cipro/flagyl x4 more days.  Patient will follow up in our office in 3 weeks and knows to call with questions or concerns.  Physical Exam: General:  Alert, NAD, pleasant, comfortable Abd:  Soft, ND, mild tenderness, incisions C/D/I    Medication List    TAKE  these medications        allopurinol 100 MG tablet  Commonly known as:   ZYLOPRIM  Take 100 mg by mouth daily.     amLODipine 10 MG tablet  Commonly known as:  NORVASC  Take 10 mg by mouth daily.     ciprofloxacin 500 MG tablet  Commonly known as:  CIPRO  Take 1 tablet (500 mg total) by mouth 2 (two) times daily.     cyclobenzaprine 5 MG tablet  Commonly known as:  FLEXERIL  Take 1 tablet (5 mg total) by mouth 3 (three) times daily as needed for muscle spasms.     Flaxseed Oil 1200 MG Caps  Take 1 capsule by mouth daily.     GLUCOSAMINE CHONDR 1500 COMPLX PO  Take 1 tablet by mouth daily.     HYDROcodone-acetaminophen 5-325 MG per tablet  Commonly known as:  NORCO/VICODIN  Take 1-2 tablets by mouth every 6 (six) hours as needed for moderate pain.     ibuprofen 600 MG tablet  Commonly known as:  ADVIL,MOTRIN  Take 1 tablet (600 mg total) by mouth every 6 (six) hours as needed (mild pain).     metroNIDAZOLE 500 MG tablet  Commonly known as:  FLAGYL  Take 1 tablet (500 mg total) by mouth 3 (three) times daily.     MULTIVITAMIN & MINERAL PO  Take 1 tablet by mouth daily.     thiamine 100 MG tablet  Commonly known as:  VITAMIN B-1  Take 100 mg by mouth daily.     vitamin C 1000 MG tablet  Take 1,000 mg by mouth daily.     vitamin E 600 UNIT capsule  Take 600 Units by mouth daily.             Follow-up Information    Follow up with [facility] On 10/11/2014.   Specialty:  General Surgery   Why:  arrive by 1PM for a 1:30PM post op check   Contact information:   [address] [place] [phone]       Signed: Ashok Norrismina Cotina Freedman, [facility] [phone]  09/20/2014, 7:46 AM

## 2014-09-20 NOTE — Progress Notes (Signed)
Discharge instructions given along with prescriptions.  Questions answered 

## 2016-04-03 IMAGING — CT CT ABD-PELV W/ CM
1 of 3 series · 13 of 32 positions shown, 18 images · IV contrast (OMNIPAQUE 300)
Comparison: None.

CLINICAL DATA: 53-year-old male with a history of right lower
quadrant pain and fever

EXAM:
CT ABDOMEN AND PELVIS WITH CONTRAST
TECHNIQUE: Multidetector CT imaging of the abdomen and pelvis was performed
using the standard protocol following bolus administration of
intravenous contrast.
CONTRAST:  25mL OMNIPAQUE IOHEXOL 300 MG/ML SOLN, 100mL OMNIPAQUE
IOHEXOL 300 MG/ML SOLN

[Series 2: abd/pel with · axial · 0.81mm/px · z∈[-470,-35]mm · 13 of 97 slices shown, 18 images]
[im 5/97  soft-tissue]
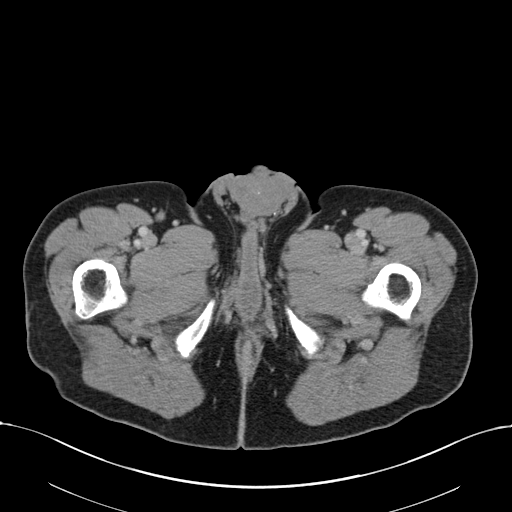
[im 5/97  bone]
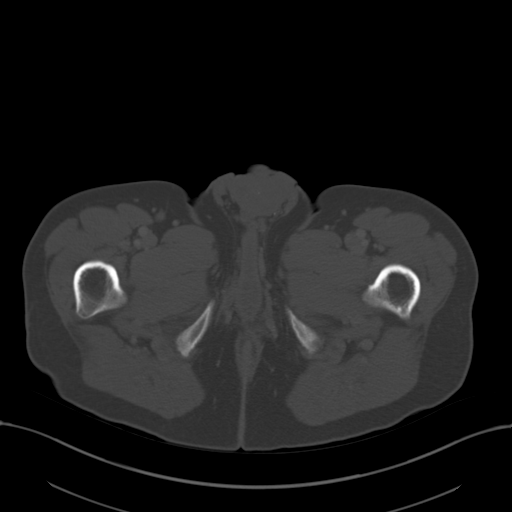
[im 15/97  soft-tissue]
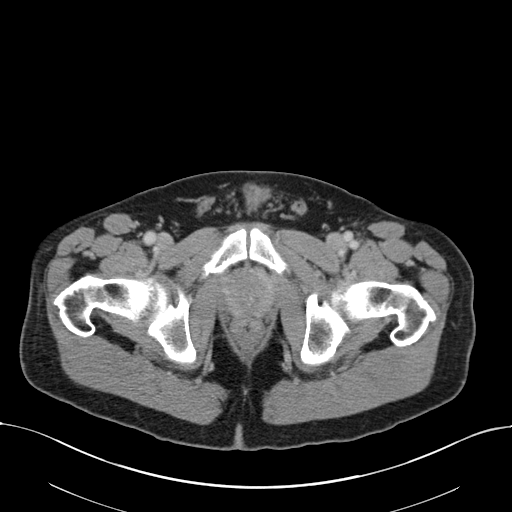
[im 20/97  soft-tissue]
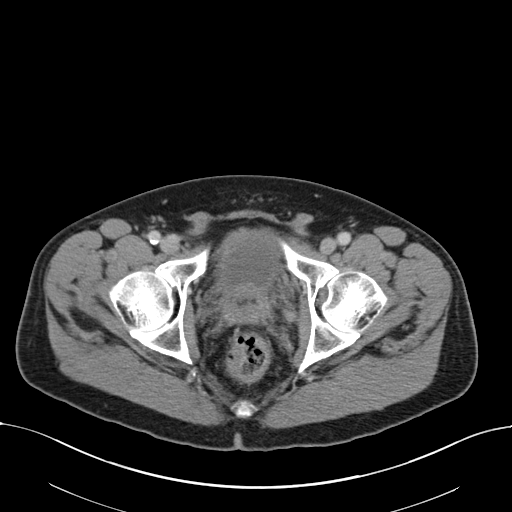
[im 29/97  soft-tissue]
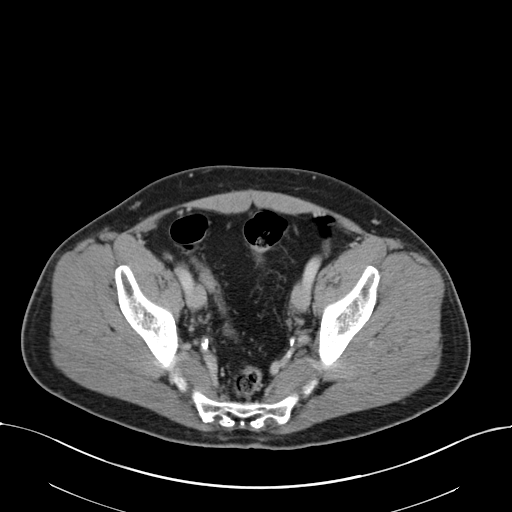
[im 39/97  soft-tissue]
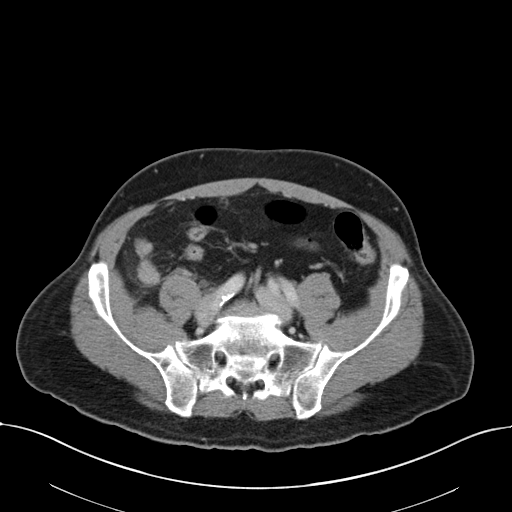
[im 44/97  soft-tissue]
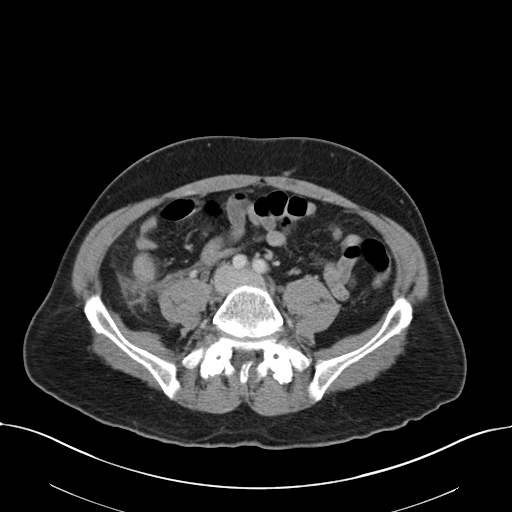
[im 53/97  soft-tissue]
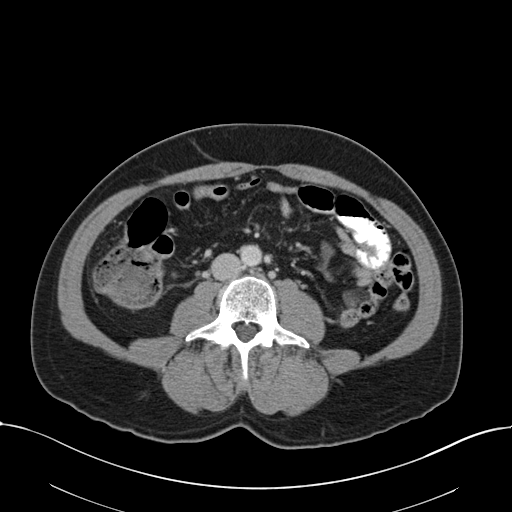
[im 58/97  soft-tissue]
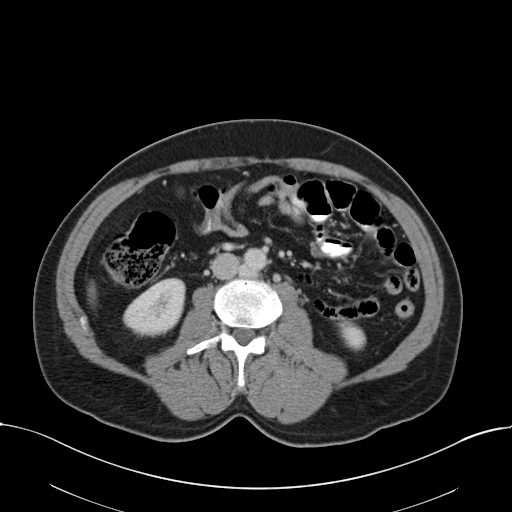
[im 68/97  soft-tissue]
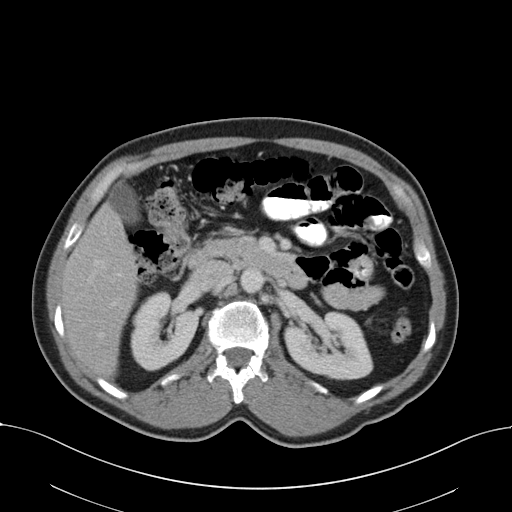
[im 68/97  bone]
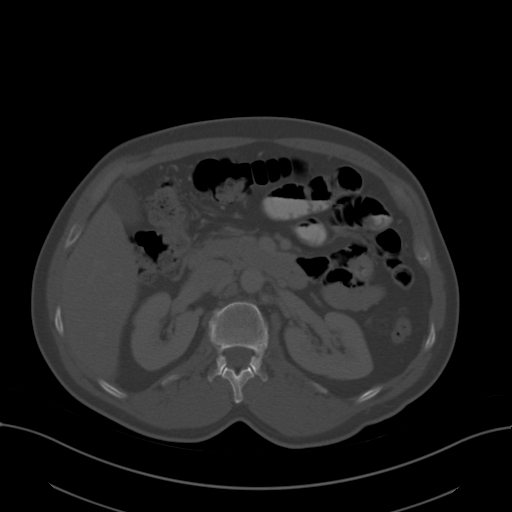
[im 77/97  soft-tissue]
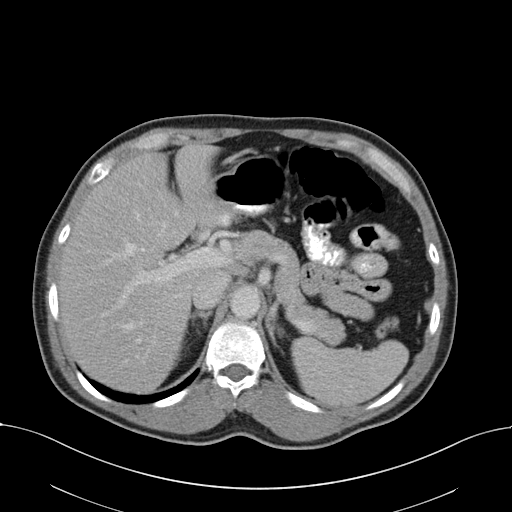
[im 77/97  lung]
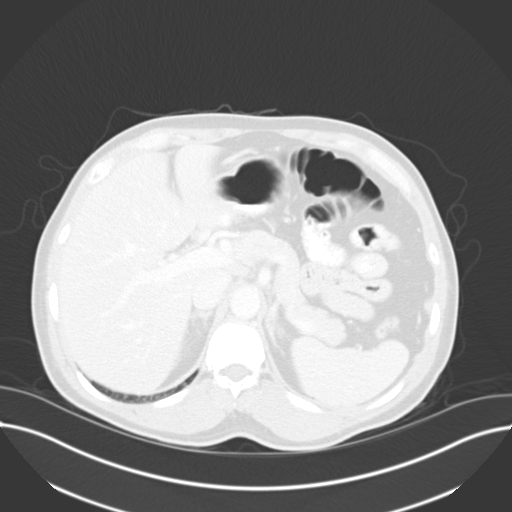
[im 82/97  soft-tissue]
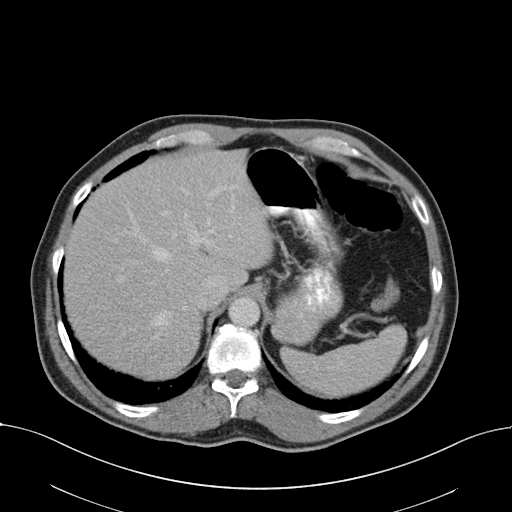
[im 82/97  lung]
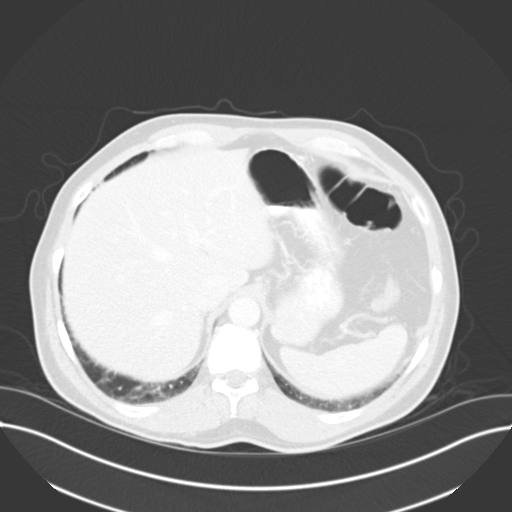
[im 87/97  lung]
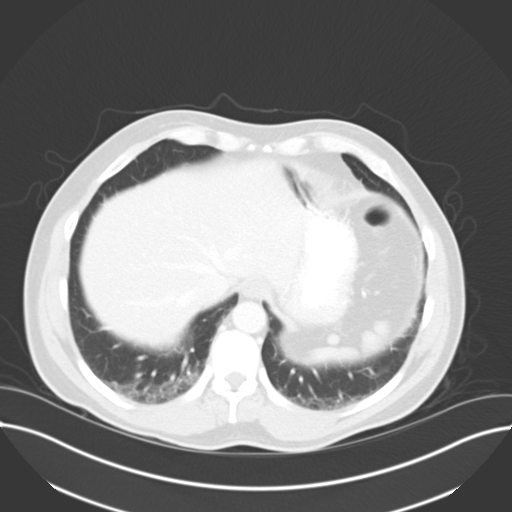
[im 92/97  soft-tissue]
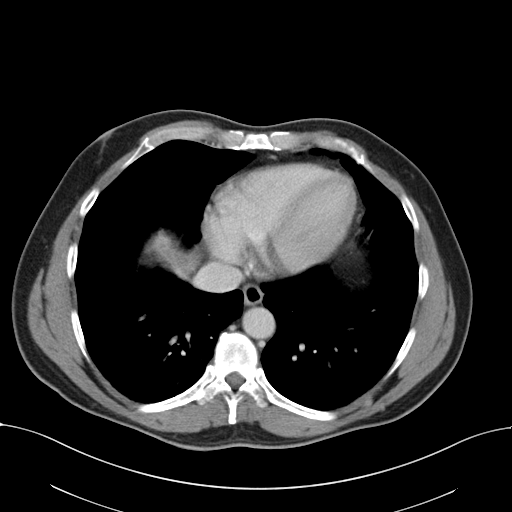
[im 92/97  lung]
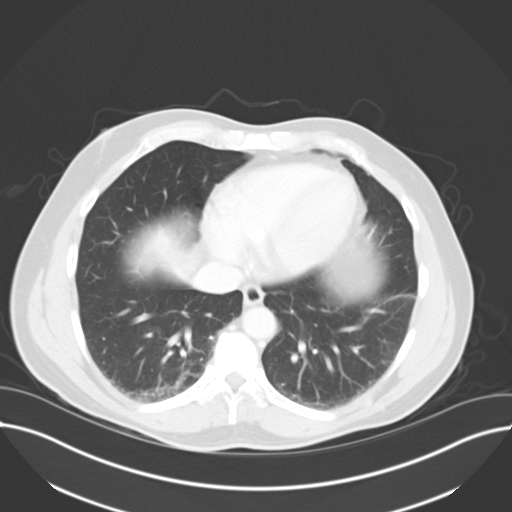

[13 of 32 positions shown; findings below may reference images not displayed]

FINDINGS: Lower chest:

Unremarkable appearance of the soft tissues of the chest wall.

Heart size within normal limits.  No pericardial fluid/thickening.

No lower mediastinal adenopathy.

Unremarkable appearance of the distal esophagus.

No hiatal hernia.

No confluent airspace disease, pleural fluid, or pneumothorax within
visualized lung.

Abdomen pelvis:

Unremarkable appearance of liver, spleen, bilateral adrenal glands.

Unremarkable pancreas.  Unremarkable gallbladder.

No hydronephrosis or nephrolithiasis. Unremarkable course of the
bilateral ureters.

Unremarkable appearance of the urinary bladder.

Oral contrast extends to the mid small bowel. No abnormally
distended small bowel loops.

The appendix measures 12 mm in diameter. Inflammatory changes
surround the appendix in the adjacent mesentery. No evidence of
focal fluid collection/abscess. No free air to indicate perforation.

Colonic diverticula without associated inflammatory changes.

Scattered atherosclerotic calcifications of the abdominal aorta and
mesenteric vessels. No aneurysm or dissection flap. Calcifications
of the bilateral iliofemoral system and pelvic vasculature.

Circumaortic left renal vein with insertion of the retroaortic
component at L3. New

No displaced fracture.

Multilevel degenerative disc disease.  No bony canal narrowing.
IMPRESSION: Acute appendicitis with no complicating features.

These results were discussed by telephone at the time of
interpretation on 09/18/2014 at [DATE] to Dr. Ilunga of surgery who
verbally acknowledged these results.

Incidental note made of a circumaortic renal vein with insertion of
the retro aortic component at L3.

Atherosclerosis.

## 2019-09-22 LAB — COLOGUARD: COLOGUARD: NEGATIVE

## 2023-06-02 LAB — COLOGUARD: COLOGUARD: POSITIVE — AB

## 2024-03-02 ENCOUNTER — Ambulatory Visit

## 2024-03-02 ENCOUNTER — Encounter: Payer: Self-pay | Admitting: Cardiovascular Disease

## 2024-03-02 ENCOUNTER — Ambulatory Visit: Attending: Cardiovascular Disease | Admitting: Cardiovascular Disease

## 2024-03-02 ENCOUNTER — Other Ambulatory Visit: Payer: Self-pay | Admitting: Cardiovascular Disease

## 2024-03-02 VITALS — BP 142/100 | HR 62 | Ht 71.0 in | Wt 187.6 lb

## 2024-03-02 DIAGNOSIS — I10 Essential (primary) hypertension: Secondary | ICD-10-CM

## 2024-03-02 DIAGNOSIS — E782 Mixed hyperlipidemia: Secondary | ICD-10-CM | POA: Diagnosis not present

## 2024-03-02 DIAGNOSIS — I350 Nonrheumatic aortic (valve) stenosis: Secondary | ICD-10-CM | POA: Diagnosis not present

## 2024-03-02 DIAGNOSIS — R931 Abnormal findings on diagnostic imaging of heart and coronary circulation: Secondary | ICD-10-CM

## 2024-03-02 DIAGNOSIS — R0602 Shortness of breath: Secondary | ICD-10-CM

## 2024-03-02 DIAGNOSIS — R030 Elevated blood-pressure reading, without diagnosis of hypertension: Secondary | ICD-10-CM
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
## 2024-03-02 NOTE — Patient Instructions (Addendum)
 Medication Instructions:  No medication changes were made at this visit. Continue current regimen.   *If you need a refill on your cardiac medications before your next appointment, please call your pharmacy*  Lab Work: None ordered today. If you have labs (blood work) drawn today and your tests are completely normal, you will receive your results only by: MyChart Message (if you have MyChart) OR A paper copy in the mail If you have any lab test that is abnormal or we need to change your treatment, we will call you to review the results.  Testing/Procedures: Your physician has requested that you have en exercise stress myoview. For further information please visit [email or web address] Please follow instruction sheet, as given.   Your physician has requested that you have an echocardiogram. Echocardiography is a painless test that uses sound waves to create images of your heart. It provides your doctor with information about the size and shape of your heart and how well your heart's chambers and valves are working. This procedure takes approximately one hour. There are no restrictions for this procedure. Please do NOT wear cologne, perfume, aftershave, or lotions (deodorant is allowed). Please arrive 15 minutes prior to your appointment time.  Please note: We ask at that you not bring children with you during ultrasound (echo/ vascular) testing. Due to room size and safety concerns, children are not allowed in the ultrasound rooms during exams. Our front office staff cannot provide observation of children in our lobby area while testing is being conducted. An adult accompanying a patient to their appointment will only be allowed in the ultrasound room at the discretion of the ultrasound technician under special circumstances. We apologize for any inconvenience.   Follow-Up: At [facility], you and your health needs are our priority.  As part of our continuing mission to provide you with  exceptional heart care, our providers are all part of one team.  This team includes your primary Cardiologist (physician) and Advanced Practice Providers or APPs (Physician Assistants and Nurse Practitioners) who all work together to provide you with the care you need, when you need it.  Your next appointment:   1 year(s)  Provider:   Ozell Fell, MD    We recommend signing up for the patient portal called MyChart.  Sign up information is provided on this After Visit Summary.  MyChart is used to connect with patients for Virtual Visits (Telemedicine).  Patients are able to view lab/test results, encounter notes, upcoming appointments, etc.  Non-urgent messages can be sent to your provider as well.   To learn more about what you can do with MyChart, go to [email or web address].   Other Instructions Your provider has requested that you complete a 24-hour blood pressure cuff evaluation.     Exercise Myoview (Stress Test) Instructions  Please arrive 15 minutes prior to your appointment time for registration and insurance purposes.   The test will take approximately 3 to 4 hours to complete; you may bring reading material.  If someone comes with you to your appointment, they will need to remain in the main lobby due to limited space in the testing area. **If you are pregnant or breastfeeding, please notify the nuclear lab prior to your appointment**   How to prepare for your Myocardial Perfusion Test: Do not eat or drink 3 hours prior to your test, except you may have water. Do not consume products containing caffeine (regular or decaffeinated) 12 hours prior to your test. (ex: coffee, chocolate, sodas, tea). Do  bring a list of your current medications with you.  If not listed below, you may take your medications as normal. Do wear comfortable clothes (no dresses or overalls) and walking shoes, tennis shoes preferred (No heels or open toe shoes are allowed). Do NOT wear cologne, perfume,  aftershave, or lotions (deodorant is allowed). If these instructions are not followed, your test will have to be rescheduled.   Please report to [address] [place], [place] [address] for your test.  If you have questions or concerns about your appointment, you can call the Nuclear Lab at [phone].   If you cannot keep your appointment, please provide 24 hours notification to the Nuclear Lab, to avoid a possible $50 charge to your account.

## 2024-03-03 ENCOUNTER — Ambulatory Visit: Payer: Self-pay | Admitting: Cardiovascular Disease

## 2024-03-03 DIAGNOSIS — I10 Essential (primary) hypertension: Secondary | ICD-10-CM

## 2024-03-03 DIAGNOSIS — R030 Elevated blood-pressure reading, without diagnosis of hypertension: Secondary | ICD-10-CM | POA: Diagnosis not present

## 2024-03-05 ENCOUNTER — Encounter (HOSPITAL_COMMUNITY): Payer: Self-pay | Admitting: *Deleted

## 2024-03-05 MED ORDER — CARVEDILOL 12.5 MG PO TABS: 12.5000 mg | ORAL_TABLET | Freq: Two times a day (BID) | ORAL | 3 refills | Status: AC

## 2024-03-16 ENCOUNTER — Other Ambulatory Visit: Payer: Self-pay | Admitting: Cardiovascular Disease

## 2024-03-16 DIAGNOSIS — R0602 Shortness of breath: Secondary | ICD-10-CM

## 2024-03-18 ENCOUNTER — Ambulatory Visit (HOSPITAL_COMMUNITY)
Admission: RE | Admit: 2024-03-18 | Discharge: 2024-03-18 | Disposition: A | Discharge status: Home / Self Care | Source: Ambulatory Visit | Attending: Internal Medicine | Admitting: Internal Medicine

## 2024-03-18 DIAGNOSIS — R0602 Shortness of breath: Secondary | ICD-10-CM | POA: Insufficient documentation

## 2024-03-18 LAB — MYOCARDIAL PERFUSION IMAGING
Angina Index: 0
Duke Treadmill Score: 5
Estimated workload: 6.4
Exercise duration (min): 4 min
Exercise duration (sec): 30 s
LV dias vol: 104 mL (ref 62–150)
LV sys vol: 43 mL (ref 4.2–5.8)
MPHR: 156 {beats}/min
Nuc Stress EF: 59 %
Peak HR: 157 {beats}/min
Percent HR: 100 %
Rest HR: 60 {beats}/min
Rest Nuclear Isotope Dose: 11 mCi
SDS: 0
SRS: 3
SSS: 0
ST Depression (mm): 0 mm
Stress Nuclear Isotope Dose: 31 mCi
TID: 1.16

## 2024-03-18 MED ORDER — TECHNETIUM TC 99M TETROFOSMIN IV KIT
31.0000 | PACK | Freq: Once | INTRAVENOUS | Status: AC | PRN
Start: 2024-03-18 — End: 2024-03-18
  Administered 2024-03-18: 31 via INTRAVENOUS

## 2024-03-18 MED ORDER — TECHNETIUM TC 99M TETROFOSMIN IV KIT
11.0000 | PACK | Freq: Once | INTRAVENOUS | Status: AC | PRN
Start: 2024-03-18 — End: 2024-03-18
  Administered 2024-03-18: 11 via INTRAVENOUS

## 2024-03-19 ENCOUNTER — Ambulatory Visit: Payer: Self-pay | Admitting: Physician Assistant

## 2024-03-21 ENCOUNTER — Ambulatory Visit: Payer: Self-pay | Admitting: Physician Assistant
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
## 2024-04-19 ENCOUNTER — Encounter: Payer: Self-pay | Admitting: Pharmacist

## 2024-04-19 ENCOUNTER — Ambulatory Visit: Attending: Cardiology | Admitting: Pharmacist

## 2024-04-19 VITALS — BP 163/99 | HR 65

## 2024-04-19 DIAGNOSIS — I10 Essential (primary) hypertension: Secondary | ICD-10-CM

## 2024-04-19 MED ORDER — OLMESARTAN MEDOXOMIL 40 MG PO TABS: 40.0000 mg | ORAL_TABLET | Freq: Every day | ORAL | 3 refills | Status: AC

## 2024-04-19 NOTE — Patient Instructions (Addendum)
 Thank you for visiting us  today:   Your blood pressure today was 163/99 with a heart rate of 58.   Medication changes:  - Stop taking losartan 100 mg daily. Start taking olmesartan  40 mg daily for better blood pressure lowering.  - Continue monitoring your blood pressure at home. Please bring a log or your online report to the next visit.  - Continue implementing dietary and exercise routine to help with blood pressure lowering.   You are due for a blood draw on Thursday, December 4th, and can go to any [facility] for this. Your next appointment will be on Monday, January 26th at 10:00 AM.

## 2024-04-19 NOTE — Assessment & Plan Note (Signed)
 Assessment: BP is un/controlled in office BP 163/ 99 mmHg above the goal (<130/80), with white coat hypertenison.  Tolerates current medications well without any side effects  Denies SOB, palpitation, chest pain, headaches,or swelling Reiterated the importance of regular exercise and low salt diet   Plan:  Start taking olmesartan  40 mg once daily. Stop taking losartan 100 mg once daily.  Continue taking amlodipine  10 mg daily and carvedilol  12.5 mg twice daily.  Patient to keep record of BP readings with heart rate and report to us  at the next visit Patient to see PharmD in 8 weeks for follow up  Follow up lab(s): BMP on December 4th.

## 2024-04-29 LAB — BASIC METABOLIC PANEL WITH GFR
BUN/Creatinine Ratio: 9 — ABNORMAL LOW (ref 10–24)
BUN: 12 mg/dL (ref 8–27)
CO2: 23 mmol/L (ref 20–29)
Calcium: 9.6 mg/dL (ref 8.6–10.2)
Chloride: 106 mmol/L (ref 96–106)
Creatinine, Ser: 1.28 mg/dL — ABNORMAL HIGH (ref 0.76–1.27)
Glucose: 86 mg/dL (ref 70–99)
Potassium: 4.6 mmol/L (ref 3.5–5.2)
Sodium: 144 mmol/L (ref 134–144)
eGFR: 62 mL/min/1.73 (ref >59)

## 2024-05-10 ENCOUNTER — Encounter: Payer: Self-pay | Admitting: Cardiovascular Disease

## 2024-05-10 ENCOUNTER — Telehealth: Payer: Self-pay | Admitting: Cardiovascular Disease

## 2024-05-10 NOTE — Telephone Encounter (Addendum)
 Im not entirely sure the diarrhea is from the [facility], Stop omlesartan for a few days to see if diarrhea improves. If it does not, he needs to see PCP or GI If diarrhea does  improves, then change to irbesartan 300mg  daily  I sent pt mychart

## 2024-05-10 NOTE — Telephone Encounter (Signed)
 Called patient. He will stop omlesartan for a few days, and then call back if his diarrhea improves.

## 2024-05-10 NOTE — Telephone Encounter (Signed)
 Spoke to patient and he reports that he has been having diarrhea that has been progressing with starting use of olmesartan . Pt reports that he can take the olmesartan  in the morning and then around lunch time he starts having diarrhea. Pt has previously been on the use of Losartan, he would like to know if there is a alternative to olmesartan  that he can use?

## 2024-05-10 NOTE — Telephone Encounter (Signed)
 Pt c/o medication issue:  1. Name of Medication: olmesartan  (BENICAR ) 40 MG tablet   2. How are you currently taking this medication (dosage and times per day)?   3. Are you having a reaction (difficulty breathing--STAT)?   4. What is your medication issue? Patient states ever since he started this medication he has had several rounds of diarrhea. Wants to know if there is something other medication he can be put on

## 2024-05-31 ENCOUNTER — Ambulatory Visit: Payer: Self-pay | Admitting: Pharmacist

## 2024-05-31 NOTE — Telephone Encounter (Signed)
 Call to discuss BMP and to see how patient is doing on olmesartan . N/A LVM.

## 2024-06-21 ENCOUNTER — Ambulatory Visit: Admitting: Pharmacist
